# Patient Record
Sex: Female | Born: 2000 | Race: White | Hispanic: No | Marital: Single | Smoking: Never smoker
Health system: Southern US, Community
[De-identification: ages and names within clinical notes are randomized; demographics above are authoritative.]

---
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
## 2022-11-02 IMAGING — US US ABDOMEN LIMITED
1 series · 15 of 25 positions shown · non-contrast
Comparison: None.

CLINICAL DATA: Right upper quadrant pain for 2 weeks

EXAM:
ULTRASOUND ABDOMEN LIMITED RIGHT UPPER QUADRANT

[Series 1: us abdomen limited ruq [facility] & [facility] · 15 of 47 slices shown]
[im 1/47]
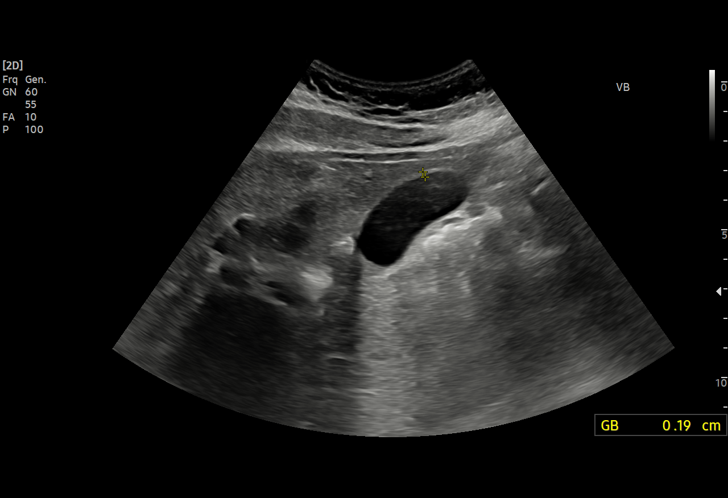
[im 4/47]
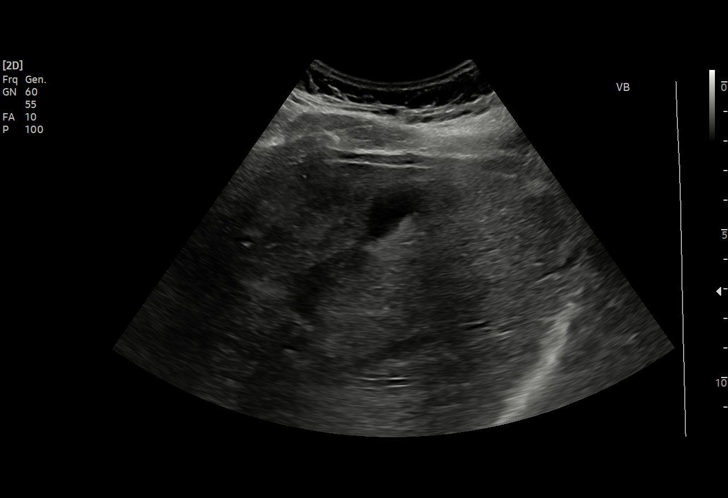
[im 8/47]
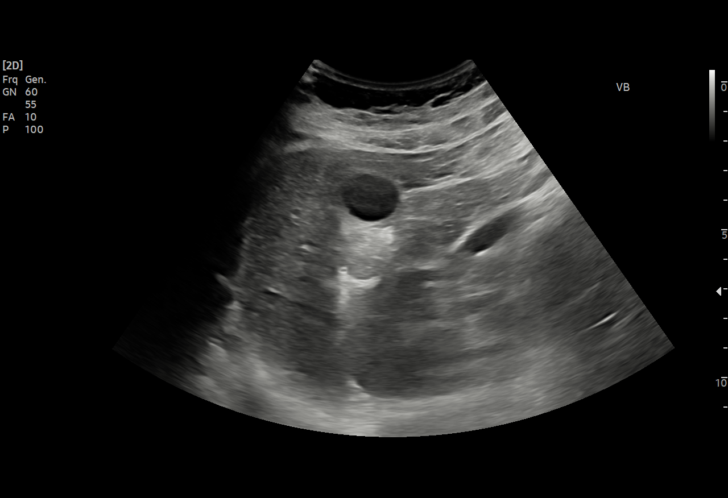
[im 10/47]
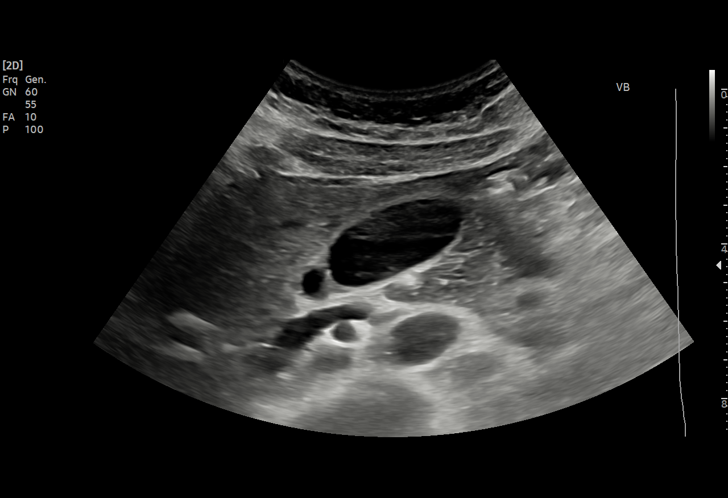
[im 14/47]
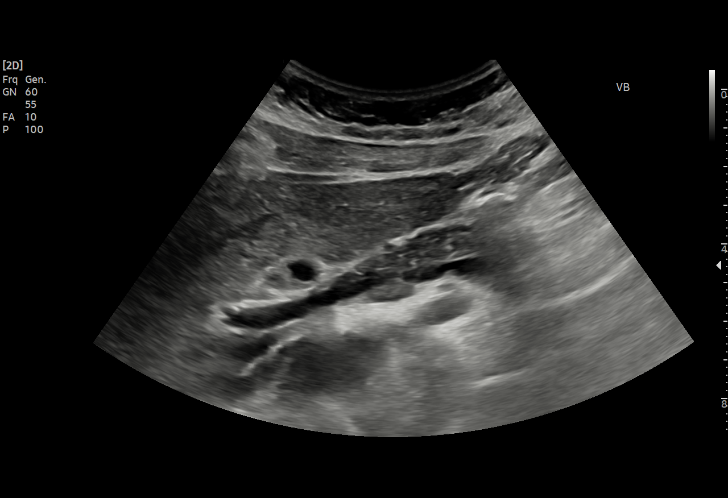
[im 18/47]
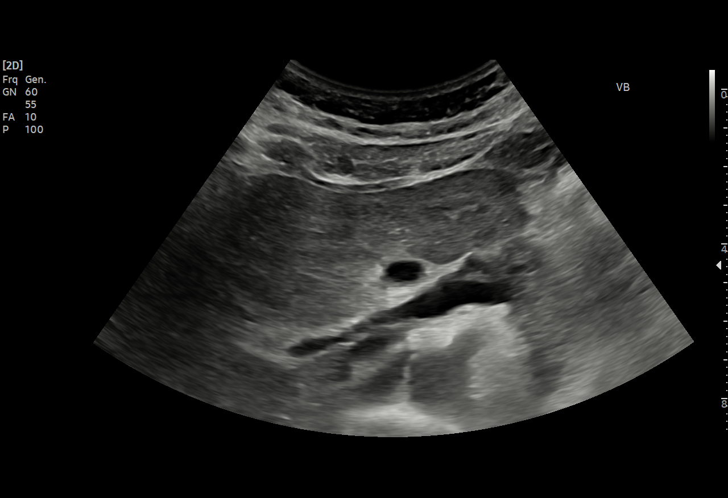
[im 20/47]
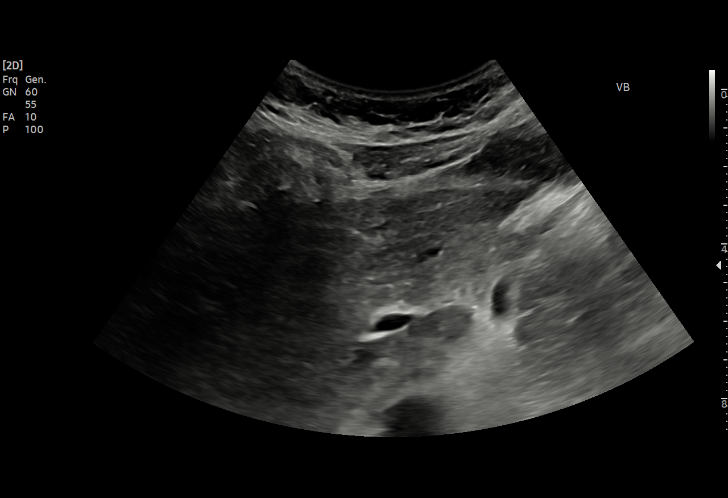
[im 24/47]
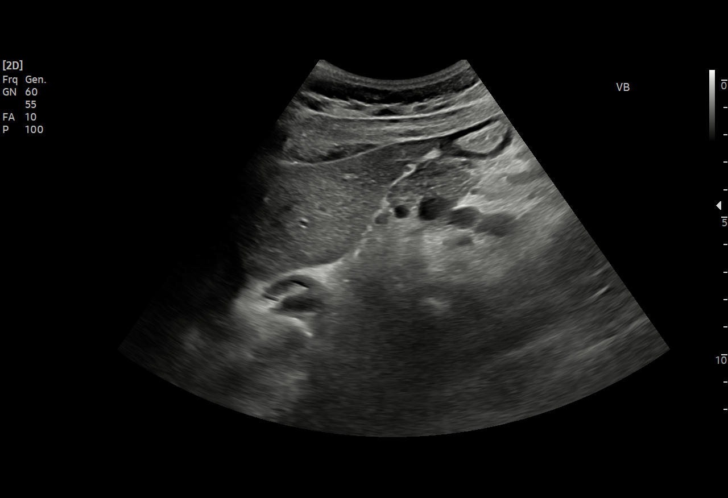
[im 27/47]
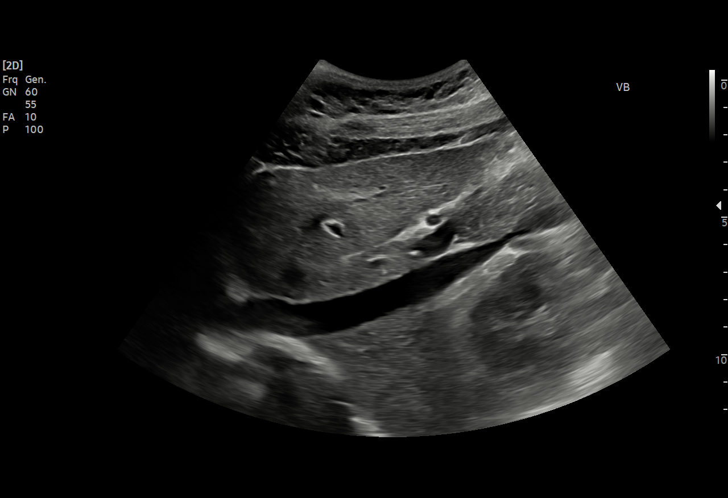
[im 29/47]
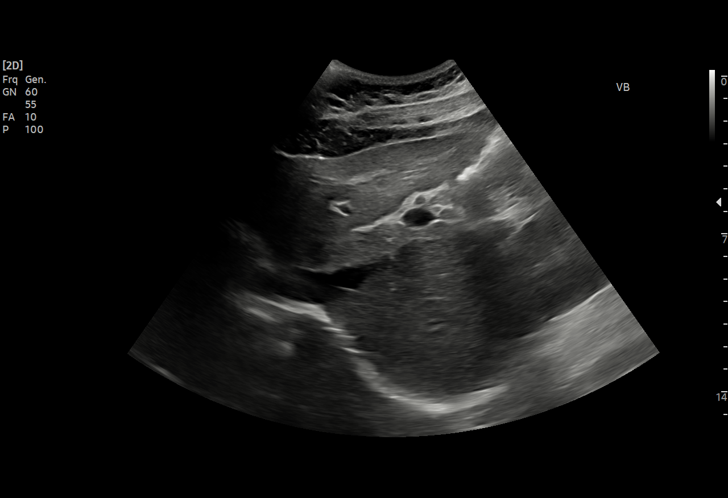
[im 33/47]
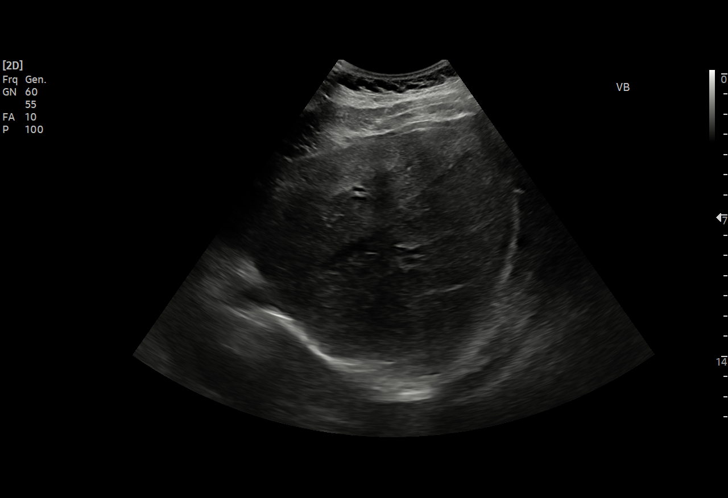
[im 37/47]
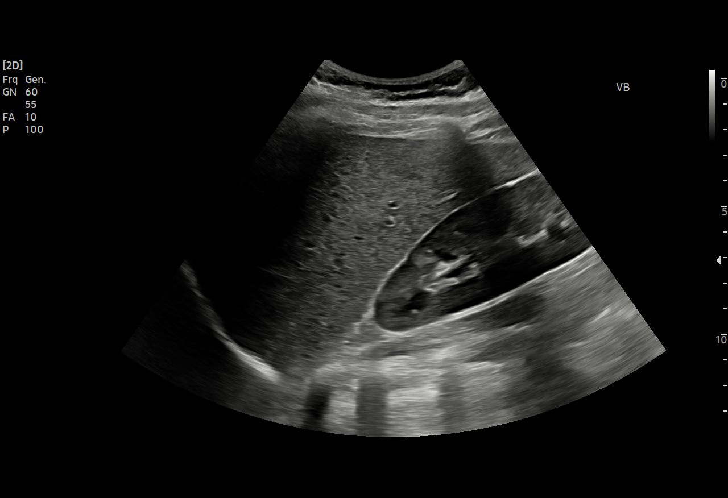
[im 39/47]
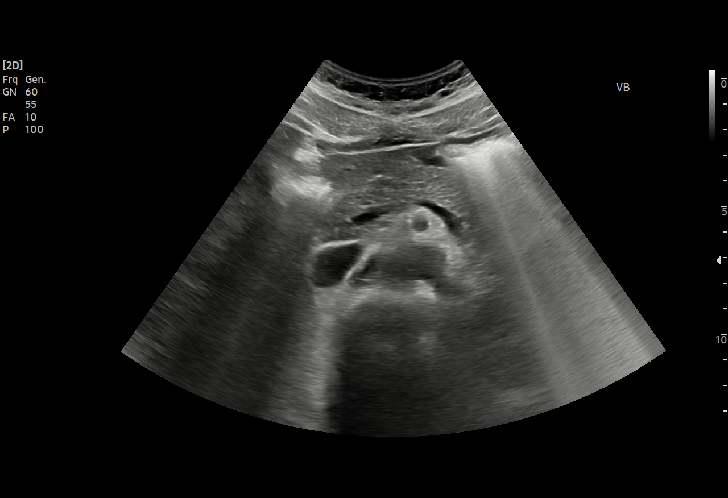
[im 43/47]
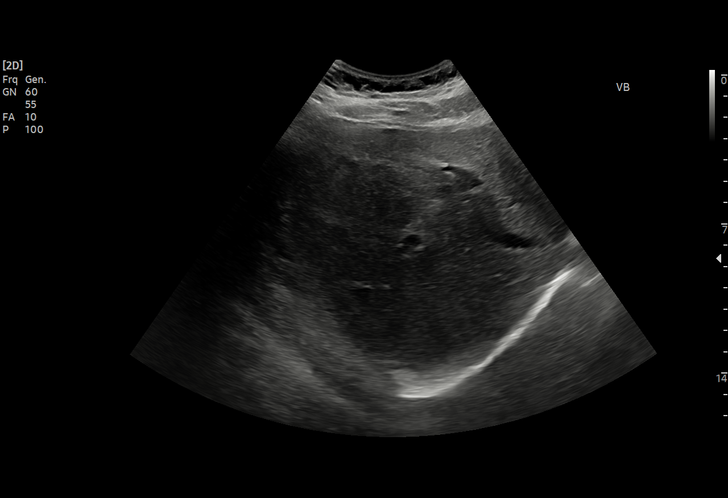
[im 47/47]
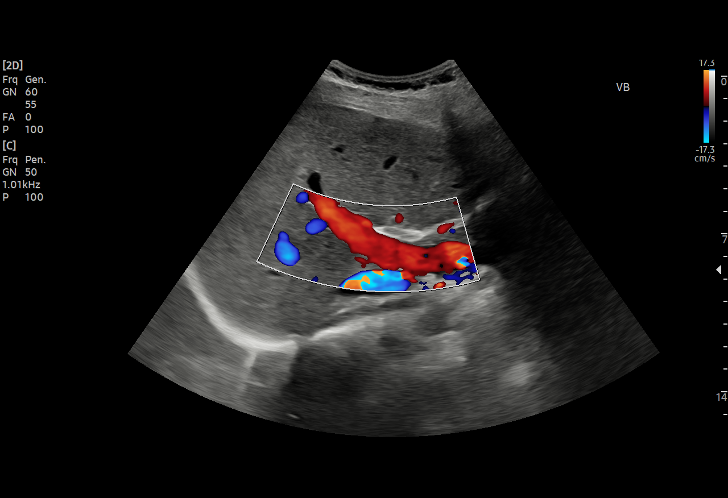

[15 of 25 positions shown; findings below may reference images not displayed]

FINDINGS: Gallbladder:

No gallstones or wall thickening visualized. No sonographic Murphy
sign noted by sonographer.

Common bile duct:

Diameter: 2 mm

Liver:

No focal lesion identified. Within normal limits in parenchymal
echogenicity. Portal vein is patent on color Doppler imaging with
normal direction of blood flow towards the liver.

Other: None.
IMPRESSION: 1. Unremarkable right upper quadrant ultrasound.
